# Patient Record
Sex: Female | Born: 1985 | Race: Black or African American | Hispanic: No | Marital: Single | Smoking: Never smoker
Health system: Southern US, Community
[De-identification: ages and names within clinical notes are randomized; demographics above are authoritative.]

---
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
## 2006-11-26 ENCOUNTER — Emergency Department (HOSPITAL_COMMUNITY): Admission: EM | Admit: 2006-11-26 | Discharge: 2006-11-26 | Payer: Self-pay | Admitting: Family Medicine

## 2007-03-17 ENCOUNTER — Ambulatory Visit (HOSPITAL_COMMUNITY): Admission: RE | Admit: 2007-03-17 | Discharge: 2007-03-17 | Payer: Self-pay | Admitting: Obstetrics

## 2007-07-31 ENCOUNTER — Inpatient Hospital Stay (HOSPITAL_COMMUNITY): Admission: AD | Admit: 2007-07-31 | Discharge: 2007-07-31 | Payer: Self-pay | Admitting: Family Medicine

## 2007-08-03 ENCOUNTER — Inpatient Hospital Stay (HOSPITAL_COMMUNITY): Admission: AD | Admit: 2007-08-03 | Discharge: 2007-08-03 | Payer: Self-pay | Admitting: Family Medicine

## 2007-08-06 ENCOUNTER — Inpatient Hospital Stay (HOSPITAL_COMMUNITY): Admission: AD | Admit: 2007-08-06 | Discharge: 2007-08-06 | Payer: Self-pay | Admitting: Obstetrics

## 2007-08-09 ENCOUNTER — Inpatient Hospital Stay (HOSPITAL_COMMUNITY): Admission: AD | Admit: 2007-08-09 | Discharge: 2007-08-09 | Payer: Self-pay | Admitting: Obstetrics & Gynecology

## 2007-08-10 ENCOUNTER — Inpatient Hospital Stay (HOSPITAL_COMMUNITY): Admission: RE | Admit: 2007-08-10 | Discharge: 2007-08-10 | Payer: Self-pay | Admitting: Obstetrics & Gynecology

## 2007-08-31 ENCOUNTER — Inpatient Hospital Stay (HOSPITAL_COMMUNITY): Admission: AD | Admit: 2007-08-31 | Discharge: 2007-08-31 | Payer: Self-pay | Admitting: Obstetrics & Gynecology

## 2007-10-18 ENCOUNTER — Inpatient Hospital Stay (HOSPITAL_COMMUNITY): Admission: AD | Admit: 2007-10-18 | Discharge: 2007-10-18 | Payer: Self-pay | Admitting: Obstetrics and Gynecology

## 2007-11-02 ENCOUNTER — Inpatient Hospital Stay (HOSPITAL_COMMUNITY): Admission: RE | Admit: 2007-11-02 | Discharge: 2007-11-02 | Payer: Self-pay | Admitting: Gynecology

## 2008-03-28 ENCOUNTER — Inpatient Hospital Stay (HOSPITAL_COMMUNITY): Admission: AD | Admit: 2008-03-28 | Discharge: 2008-04-02 | Payer: Self-pay | Admitting: Obstetrics and Gynecology

## 2009-02-21 ENCOUNTER — Emergency Department (HOSPITAL_BASED_OUTPATIENT_CLINIC_OR_DEPARTMENT_OTHER): Admission: EM | Admit: 2009-02-21 | Discharge: 2009-02-21 | Payer: Self-pay | Admitting: Emergency Medicine

## 2009-02-23 ENCOUNTER — Emergency Department (HOSPITAL_BASED_OUTPATIENT_CLINIC_OR_DEPARTMENT_OTHER): Admission: EM | Admit: 2009-02-23 | Discharge: 2009-02-23 | Payer: Self-pay | Admitting: Emergency Medicine

## 2009-08-13 ENCOUNTER — Emergency Department (HOSPITAL_COMMUNITY): Admission: EM | Admit: 2009-08-13 | Discharge: 2009-08-13 | Payer: Self-pay | Admitting: Family Medicine

## 2009-11-14 ENCOUNTER — Emergency Department (HOSPITAL_COMMUNITY): Admission: EM | Admit: 2009-11-14 | Discharge: 2009-11-14 | Payer: Self-pay | Admitting: Emergency Medicine

## 2009-12-17 ENCOUNTER — Emergency Department (HOSPITAL_COMMUNITY): Admission: EM | Admit: 2009-12-17 | Discharge: 2009-12-17 | Payer: Self-pay | Admitting: Family Medicine

## 2010-02-14 ENCOUNTER — Emergency Department (HOSPITAL_COMMUNITY): Admission: EM | Admit: 2010-02-14 | Discharge: 2010-02-14 | Payer: Self-pay | Admitting: Family Medicine

## 2010-11-24 ENCOUNTER — Encounter: Payer: Self-pay | Admitting: Obstetrics

## 2011-03-18 NOTE — Op Note (Signed)
NAMECARMIN, Angel Copeland                 ACCOUNT NO.:  [id number]   MEDICAL RECORD NO.:  [id number]          PATIENT TYPE:  INP   LOCATION:  9373                          FACILITY:  [facility]   PHYSICIAN:  Sherron Monday, MD        DATE OF BIRTH:  December 06, 1985   DATE OF PROCEDURE:  03/30/2008  DATE OF DISCHARGE:                               OPERATIVE REPORT   PREOPERATIVE DIAGNOSES:  1. Intrauterine pregnancy at term.  2. Preeclampsia.  3. Arrest of dilation.   POSTOPERATIVE DIAGNOSES:  1. Intrauterine pregnancy at term.  2. Preeclampsia.  3. Arrest of dilation.  4. Delivered via a low transverse cesarean section.   PROCEDURE:  Primary low transverse cesarean section.   SURGEON:  Sherron Monday, MD   ANESTHESIA:  Epidural.   FINDINGS:  Viable female infant at 0027, Mar 30, 2008, with Apgars of 8  at 1 minute and 9 at 5 minutes, and weight of 7 pounds 12 ounces.  Normal uterus, tubes, and ovaries.   COMPLICATIONS:  None.   PATHOLOGY:  None.   IV FLUIDS:  2500 mL.   URINE OUTPUT:  200 mL of clear urine at the end of procedure.   ESTIMATED BLOOD LOSS:  800 mL.   DISPOSITION:  To the PACU following the procedure.   PROCEDURE:  After informed consent was reviewed with patient including  the risks, benefits, and alternatives of the procedure including, but  not limited to bleeding, infection, damage to surrounding organs, as  well as trouble of healing; she was transferred to the operating room  where epidural was idosed and found to be adequate.  She was then  prepped and draped in the normal sterile fashion.  A Pfannenstiel skin  incision was made approximately 2 fingerbreadths above the pubic  symphysis and carried through to the underlying layer of fascia bluntly  and sharply with a Bovie cautery.  The incision was extended from the  midline with Mayo scissors.  The inferior aspect of this fascial  incision was grasped with Kocher clamp and elevated.  The peritoneum and  rectus  muscles were dissected off bluntly and sharply.  Attention was  then turned to the superior aspect of this incision, which was in  similar fashion was grasped with Kocher clamps and elevated, and the  rectus muscle was dissected off bluntly and sharply.  Midline was easily  identified and peritoneum was entered bluntly.  Peritoneal incision was  extended with good visualization.  The Alexis retractor was placed.  After confirmation of the placement, the placement was checked to assure  that no bowel was entrapped.  Midline was easily visualized, Peritoneum  was entered bluntly.  The vesicouterine peritoneum was then identified.   Bladder flap was created using Metzenbaum scissors.  The uterus was  incised in a transverse fashion.  The infant was delivered from vertex  presentation.  The nose and mouth were suctioned and the cord was  clamped and cut, the infant was handed off to the awaiting pediatric  staff.  Attention was then turned to express placenta, and placenta  expressed intact.  Uterus was cleared of all clots and debris.  Uterine  incision was closed in 2 layers with first as a running-locked layer and  the second as an imbricating.  Hemostasis was assured with additional  sutures of 0 Vicryl.  The gutters were cleared of all clots and debris,  and irrigation was performed.  Subfascial planes were inspected and made  hemostatic with Bovie cautery.  The fascia was closed with 0 Vicryl in a  running fashion.  Subcuticular adipose layer was made hemostatic with  Bovie cautery.  The skin was closed with staples after irrigation had  been performed.  The patient tolerated the procedure well.  Sponge, lap,  and needle counts were correct x2 at the end of the procedure.      Sherron Monday, MD  Electronically Signed     JB/MEDQ  D:  03/30/2008  T:  03/30/2008  Job:  [id number]

## 2011-03-21 NOTE — Discharge Summary (Signed)
NAMESHERIDEN, ARCHIBEQUE                 ACCOUNT NO.:  [id number]   MEDICAL RECORD NO.:  [id number]          PATIENT TYPE:  INP   LOCATION:  9132                          FACILITY:  [facility]   PHYSICIAN:  Huel Cote, M.D. DATE OF BIRTH:  1985-11-17   DATE OF ADMISSION:  03/28/2008  DATE OF DISCHARGE:  04/02/2008                               DISCHARGE SUMMARY   DISCHARGE DIAGNOSES:  1. Term pregnancy at 47 and 6/7th weeks, delivered.  2. Status post primary low transverse cesarean section for arrest of      dilation.  3. Pregnancy-induced hypertension with proteinuria and probable mild      preeclampsia.   DISCHARGE MEDICATIONS:  1. Motrin 600 mg p.o. every 6 hours.  2. Percocet 1-2 tablets p.o. every 4 hours p.r.n.   DISCHARGE FOLLOWUP:  The patient is to follow up in the office in  approximately 2 weeks for an incision check.   HOSPITAL COURSE:  The patient is a 25 year old G1 P0 who was admitted at  42 and 6/7th weeks gestation for induction of labor, given blood  pressures increasing to 140s over 90 to 106.  PIH labs had been normal;  however, urine catheterization revealed a urine with approximately 100  of protein and the patient presented with a mild headache.  Prenatal  labs are as follows, AB positive, antibody negative, RPR nonreactive,  rubella immune, hepatitis B surface, antigen negative, HIV negative, GC  negative, Chlamydia negative, group B strep positive, 1-hour Glucola  183, 3-hour Glucola was never done by the patient due to noncompliance.  First trimester screen was normal.   PAST OBSTETRICAL HISTORY:  None.   PAST GYN HISTORY:  None.   PAST MEDICAL HISTORY:  None.   PAST SURGICAL HISTORY:  None.   MEDICATIONS:  Prenatal vitamins only.   HOSPITAL COURSE:  On admission, blood pressure is 143/91.  Cervix was 32  and -3 station.  She did have 2+ pedal edema noted.  PIH labs were  normal.  Given increased blood pressure and the new finding of  proteinuria,  it was felt that the patient should proceed with delivery  even in light of a slightly unfavorable cervix.  She was placed on  penicillin for her group B strep positive status, and was placed on  Pitocin.  We were going to hold off on the magnesium until we determined  what her blood pressures would be in labor.  Approximately 4 hours after  admission, her cervix was 52+ and -2 station, and she had received her  penicillin.  She then had rupture of membranes performed with clear  fluid noted.  Blood pressures remained in the 90s to 100 diastolics.  As  she received an epidural at approximately 3 cm dilation, and at that  point, had a IUPC placed to adjust her Pitocin.  She progressed to  approximately 6-7 cm despite adequate contractions; however, over  several hours really could not progressed beyond this and was counseled  and proceeded with a cesarean section.  She was delivered of a viable  female infant, Apgars were 8  and 9 and weight of 7 pounds 12 ounces.  She was then admitted for routine postoperative care.  She was placed on  magnesium postpartum due to increasing blood pressures and this was  continued for approximately 1-1/2 days postpartum.  This was  discontinued and on postop day #3, she was doing quite well.  Her  incision was clear, staples removed, and Steri-Strips placed.  She was  tolerating regular diet.  Blood pressures were stable at 140s over 90s  and she was felt stable for discharge home.      Huel Cote, M.D.  Electronically Signed     KR/MEDQ  D:  04/26/2008  T:  04/27/2008  Job:  [id number]

## 2011-07-30 LAB — COMPREHENSIVE METABOLIC PANEL
ALT: 9
ALT: 9
AST: 15
AST: 18
Albumin: 2.7 — ABNORMAL LOW
Alkaline Phosphatase: 103
CO2: 24
CO2: 26
Calcium: 9
Chloride: 105
Chloride: 105
Creatinine, Ser: 0.59
GFR calc non Af Amer: >60
Glucose, Bld: 141 — ABNORMAL HIGH
Sodium: 137
Total Bilirubin: 0.3
Total Protein: 5.1 — ABNORMAL LOW
Total Protein: 6.5

## 2011-07-30 LAB — CBC
HCT: 25.3 — ABNORMAL LOW
Hemoglobin: 11.3 — ABNORMAL LOW
MCHC: 33.1
MCV: 77.7 — ABNORMAL LOW
RBC: 4.38
RDW: 14
RDW: 14.1
WBC: 5.9

## 2011-07-30 LAB — URINALYSIS, ROUTINE W REFLEX MICROSCOPIC
Bilirubin Urine: NEGATIVE
Glucose, UA: NEGATIVE
Hgb urine dipstick: NEGATIVE
Leukocytes, UA: NEGATIVE
Specific Gravity, Urine: 1.02
Urobilinogen, UA: 0.2

## 2011-07-30 LAB — URINE MICROSCOPIC-ADD ON

## 2011-07-30 LAB — LACTATE DEHYDROGENASE: LDH: 173

## 2011-08-08 LAB — URINALYSIS, ROUTINE W REFLEX MICROSCOPIC
Bilirubin Urine: NEGATIVE
Glucose, UA: NEGATIVE
Hgb urine dipstick: NEGATIVE
Ketones, ur: 15 — AB
Nitrite: NEGATIVE
Protein, ur: NEGATIVE
Specific Gravity, Urine: 1.015
Urobilinogen, UA: 0.2
pH: 7.5

## 2011-08-08 LAB — GC/CHLAMYDIA PROBE AMP, GENITAL: Chlamydia, DNA Probe: NEGATIVE

## 2011-08-08 LAB — WET PREP, GENITAL: Trich, Wet Prep: NONE SEEN

## 2011-08-13 LAB — URINALYSIS, ROUTINE W REFLEX MICROSCOPIC
Bilirubin Urine: NEGATIVE
Glucose, UA: NEGATIVE
Hgb urine dipstick: NEGATIVE
Urobilinogen, UA: 0.2
pH: 7

## 2011-08-13 LAB — WET PREP, GENITAL: Trich, Wet Prep: NONE SEEN

## 2011-08-14 LAB — URINE MICROSCOPIC-ADD ON

## 2011-08-14 LAB — POCT PREGNANCY, URINE: Preg Test, Ur: POSITIVE

## 2011-08-14 LAB — URINALYSIS, ROUTINE W REFLEX MICROSCOPIC
Bilirubin Urine: NEGATIVE
Glucose, UA: NEGATIVE
Glucose, UA: NEGATIVE
Hgb urine dipstick: NEGATIVE
Hgb urine dipstick: NEGATIVE
Ketones, ur: NEGATIVE
Leukocytes, UA: NEGATIVE
Protein, ur: NEGATIVE
Specific Gravity, Urine: 1.02
Specific Gravity, Urine: 1.02
Urobilinogen, UA: 1
Urobilinogen, UA: 0.2

## 2011-08-14 LAB — WET PREP, GENITAL
Clue Cells Wet Prep HPF POC: NONE SEEN
Trich, Wet Prep: NONE SEEN
Yeast Wet Prep HPF POC: NONE SEEN

## 2011-08-14 LAB — GC/CHLAMYDIA PROBE AMP, GENITAL: Chlamydia, DNA Probe: NEGATIVE

## 2011-08-14 LAB — HCG, QUANTITATIVE, PREGNANCY: hCG, Beta Chain, Quant, S: 15537 — ABNORMAL HIGH

## 2011-08-14 LAB — CBC
HCT: 32.7 — ABNORMAL LOW
Hemoglobin: 11 — ABNORMAL LOW
MCV: 75.4 — ABNORMAL LOW
Platelets: 404 — ABNORMAL HIGH
RBC: 4.33
WBC: 8.1

## 2011-08-14 LAB — ABO/RH: ABO/RH(D): AB POS
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
## 2013-01-19 ENCOUNTER — Emergency Department (HOSPITAL_COMMUNITY)
Admission: EM | Admit: 2013-01-19 | Discharge: 2013-01-19 | Discharge status: Absent without leave | Payer: 59 | Source: Home / Self Care | Attending: Emergency Medicine | Admitting: Emergency Medicine

## 2013-01-21 ENCOUNTER — Inpatient Hospital Stay (HOSPITAL_COMMUNITY)
Admission: AD | Admit: 2013-01-21 | Discharge: 2013-01-21 | Disposition: A | Discharge status: Home / Self Care | Payer: 59 | Source: Ambulatory Visit | Attending: Obstetrics and Gynecology | Admitting: Obstetrics and Gynecology

## 2013-01-21 ENCOUNTER — Encounter (HOSPITAL_COMMUNITY): Payer: Self-pay | Admitting: General Practice

## 2013-01-21 DIAGNOSIS — N760 Acute vaginitis: Secondary | ICD-10-CM | POA: Insufficient documentation

## 2013-01-21 DIAGNOSIS — A499 Bacterial infection, unspecified: Secondary | ICD-10-CM | POA: Insufficient documentation

## 2013-01-21 DIAGNOSIS — R109 Unspecified abdominal pain: Secondary | ICD-10-CM | POA: Insufficient documentation

## 2013-01-21 DIAGNOSIS — B9689 Other specified bacterial agents as the cause of diseases classified elsewhere: Secondary | ICD-10-CM | POA: Insufficient documentation

## 2013-01-21 DIAGNOSIS — Z3202 Encounter for pregnancy test, result negative: Secondary | ICD-10-CM | POA: Insufficient documentation

## 2013-01-21 LAB — CBC WITH DIFFERENTIAL/PLATELET
Basophils Relative: 0 % (ref 0–1)
Eosinophils Absolute: 0.1 10*3/uL (ref 0.0–0.7)
Eosinophils Relative: 2 % (ref 0–5)
Hemoglobin: 12.1 g/dL (ref 12.0–15.0)
Lymphs Abs: 2.2 10*3/uL (ref 0.7–4.0)
MCH: 25.7 pg — ABNORMAL LOW (ref 26.0–34.0)
MCHC: 32.1 g/dL (ref 30.0–36.0)
MCV: 80.2 fL (ref 78.0–100.0)
Monocytes Relative: 6 % (ref 3–12)
Platelets: 288 10*3/uL (ref 150–400)
RBC: 4.7 MIL/uL (ref 3.87–5.11)

## 2013-01-21 LAB — URINALYSIS, ROUTINE W REFLEX MICROSCOPIC
Bilirubin Urine: NEGATIVE
Hgb urine dipstick: NEGATIVE
Ketones, ur: 15 mg/dL — AB
Nitrite: NEGATIVE
Specific Gravity, Urine: 1.025 (ref 1.005–1.030)
Urobilinogen, UA: 0.2 mg/dL (ref 0.0–1.0)

## 2013-01-21 LAB — WET PREP, GENITAL
Trich, Wet Prep: NONE SEEN
Yeast Wet Prep HPF POC: NONE SEEN

## 2013-01-21 LAB — URINE MICROSCOPIC-ADD ON

## 2013-01-21 MED ORDER — METRONIDAZOLE 500 MG PO TABS: 500.0000 mg | ORAL_TABLET | Freq: Two times a day (BID) | ORAL | Status: DC

## 2013-01-21 NOTE — MAU Note (Addendum)
Pt stated stomach is hurting her since  01/14/2013 , she vomited 2 days ago and today. Has a headache and back pain

## 2013-01-21 NOTE — MAU Provider Note (Signed)
History     CSN: [id number]  Arrival date and time: 01/21/13 1534   First Provider Initiated Contact with Patient 01/21/13 1630      Chief Complaint  Patient presents with  . Abdominal Pain   HPI Angel Copeland is 27 y.o. G1P0011 Unknown weeks presenting with abdominal pain X 1 week.  Describes as cramping but not as painful as cramping in her lower abdomen.  Patient of Dr. Emeline Darling.  Didn't call office because it is hard for her to get off work. LMP2/15/14.  Normal cycle for her.  She is now 1 week late.  Sexually active, not using contraception because it makes her sick.  Had implanon last 2 years ago.  She would not be opposed to returning to pills but prefers something that doesn't require daily use.  Nausea and vomiting.   Has vomited X 2 today.  Denies fever, chills, constipation, diarrhea, or UTI sxs. 1 partner X 12 years.    History reviewed. No pertinent past medical history.  Past Surgical History  Procedure Laterality Date  . Cesarean section      Family History  Problem Relation Age of Onset  . Diabetes Father     History  Substance Use Topics  . Smoking status: Not on file  . Smokeless tobacco: Not on file  . Alcohol Use: Yes    Allergies: No Known Allergies  Prescriptions prior to admission  Medication Sig Dispense Refill  . Aspirin-Acetaminophen-Caffeine (PAMPRIN MAX PO) Take 1 tablet by mouth daily as needed (for cramps).        Review of Systems  Constitutional: Negative for fever and chills.  Gastrointestinal: Positive for nausea, vomiting and abdominal pain. Negative for diarrhea and constipation.  Genitourinary: Negative.        Menses 1 week late   Physical Exam   Blood pressure 125/75, temperature 98.1 F (36.7 C), temperature source Oral, resp. rate 18, height 5\' 2"  (1.575 m), weight 214 lb 12.8 oz (97.433 kg), last menstrual period 12/17/2012, SpO2 100.00%.  Physical Exam  Constitutional: She is oriented to person, place, and time. She  appears well-developed and well-nourished. No distress.  HENT:  Head: Normocephalic.  Neck: Normal range of motion.  Cardiovascular: Normal rate.   Respiratory: Effort normal.  GI: Soft. She exhibits no distension and no mass. There is no tenderness. There is no rebound and no guarding.  Genitourinary: There is no rash, tenderness or lesion on the right labia. There is no rash, tenderness or lesion on the left labia. Uterus is tender (mild). Uterus is not enlarged. Cervix exhibits no motion tenderness, no discharge and no friability. Right adnexum displays no mass, no tenderness and no fullness. Left adnexum displays no mass, no tenderness and no fullness. No bleeding around the vagina. Vaginal discharge (small amount of  frothy white discharge without odor) found.  Neurological: She is alert and oriented to person, place, and time.  Skin: Skin is warm and dry.  Psychiatric: She has a normal mood and affect. Her behavior is normal.   Results for orders placed during the hospital encounter of 01/21/13 (from the past 24 hour(s))  URINALYSIS, ROUTINE W REFLEX MICROSCOPIC     Status: Abnormal   Collection Time    01/21/13  3:47 PM      Result Value Range   Color, Urine YELLOW  YELLOW   APPearance CLEAR  CLEAR   Specific Gravity, Urine 1.025  1.005 - 1.030   pH 6.0  5.0 - 8.0  Glucose, UA >1000 (*) NEGATIVE mg/dL   Hgb urine dipstick NEGATIVE  NEGATIVE   Bilirubin Urine NEGATIVE  NEGATIVE   Ketones, ur 15 (*) NEGATIVE mg/dL   Protein, ur NEGATIVE  NEGATIVE mg/dL   Urobilinogen, UA 0.2  0.0 - 1.0 mg/dL   Nitrite NEGATIVE  NEGATIVE   Leukocytes, UA NEGATIVE  NEGATIVE  URINE MICROSCOPIC-ADD ON     Status: Abnormal   Collection Time    01/21/13  3:47 PM      Result Value Range   Squamous Epithelial / LPF FEW (*) RARE   WBC, UA 0-2  <3 WBC/hpf   RBC / HPF 0-2  <3 RBC/hpf   Bacteria, UA RARE  RARE  POCT PREGNANCY, URINE     Status: None   Collection Time    01/21/13  3:58 PM      Result  Value Range   Preg Test, Ur NEGATIVE  NEGATIVE  WET PREP, GENITAL     Status: Abnormal   Collection Time    01/21/13  4:50 PM      Result Value Range   Yeast Wet Prep HPF POC NONE SEEN  NONE SEEN   Trich, Wet Prep NONE SEEN  NONE SEEN   Clue Cells Wet Prep HPF POC MODERATE (*) NONE SEEN   WBC, Wet Prep HPF POC FEW (*) NONE SEEN  CBC WITH DIFFERENTIAL     Status: Abnormal   Collection Time    01/21/13  5:20 PM      Result Value Range   WBC 6.6  4.0 - 10.5 K/uL   RBC 4.70  3.87 - 5.11 MIL/uL   Hemoglobin 12.1  12.0 - 15.0 g/dL   HCT 16.1  09.6 - 04.5 %   MCV 80.2  78.0 - 100.0 fL   MCH 25.7 (*) 26.0 - 34.0 pg   MCHC 32.1  30.0 - 36.0 g/dL   RDW 40.9  81.1 - 91.4 %   Platelets 288  150 - 400 K/uL   Neutrophils Relative 59  43 - 77 %   Neutro Abs 3.9  1.7 - 7.7 K/uL   Lymphocytes Relative 33  12 - 46 %   Lymphs Abs 2.2  0.7 - 4.0 K/uL   Monocytes Relative 6  3 - 12 %   Monocytes Absolute 0.4  0.1 - 1.0 K/uL   Eosinophils Relative 2  0 - 5 %   Eosinophils Absolute 0.1  0.0 - 0.7 K/uL   Basophils Relative 0  0 - 1 %   Basophils Absolute 0.0  0.0 - 0.1 K/uL   MAU Course  Procedures  MDM 17:15  Reported MSE to Dr. Senaida Ores.   Assessment and Plan  A:  Abdominal pain      Negative pregnancy test     Bacterial vaginosis  P  Rx for Flagyl 500mg  bid X 1 week     Return to [facility] to discuss birth control.  Angel Copeland,EVE M 01/21/2013, 5:52 PM

## 2013-01-22 LAB — GC/CHLAMYDIA PROBE AMP: CT Probe RNA: NEGATIVE

## 2013-03-04 ENCOUNTER — Ambulatory Visit: Payer: 59 | Admitting: Diagnostic Neuroimaging

## 2013-10-29 ENCOUNTER — Emergency Department (HOSPITAL_BASED_OUTPATIENT_CLINIC_OR_DEPARTMENT_OTHER)
Admission: EM | Admit: 2013-10-29 | Discharge: 2013-10-29 | Disposition: A | Discharge status: Home / Self Care | Payer: 59 | Attending: Emergency Medicine | Admitting: Emergency Medicine

## 2013-10-29 ENCOUNTER — Encounter (HOSPITAL_BASED_OUTPATIENT_CLINIC_OR_DEPARTMENT_OTHER): Payer: Self-pay | Admitting: Emergency Medicine

## 2013-10-29 DIAGNOSIS — R51 Headache: Secondary | ICD-10-CM | POA: Insufficient documentation

## 2013-10-29 DIAGNOSIS — R6883 Chills (without fever): Secondary | ICD-10-CM | POA: Insufficient documentation

## 2013-10-29 DIAGNOSIS — R6889 Other general symptoms and signs: Secondary | ICD-10-CM

## 2013-10-29 DIAGNOSIS — J3489 Other specified disorders of nose and nasal sinuses: Secondary | ICD-10-CM | POA: Insufficient documentation

## 2013-10-29 DIAGNOSIS — IMO0001 Reserved for inherently not codable concepts without codable children: Secondary | ICD-10-CM | POA: Insufficient documentation

## 2013-10-29 DIAGNOSIS — J111 Influenza due to unidentified influenza virus with other respiratory manifestations: Secondary | ICD-10-CM | POA: Insufficient documentation

## 2013-10-29 DIAGNOSIS — J029 Acute pharyngitis, unspecified: Secondary | ICD-10-CM | POA: Insufficient documentation

## 2013-10-29 MED ORDER — GUAIFENESIN-CODEINE 100-10 MG/5ML PO SYRP: 5.0000 mL | ORAL_SOLUTION | Freq: Three times a day (TID) | ORAL | Status: AC | PRN

## 2013-10-29 NOTE — ED Notes (Signed)
Patient states no N/V since Thursday. Now presents with daughter for same symptoms., cough, congestion, headache.

## 2013-10-29 NOTE — ED Provider Notes (Signed)
CSN: [id number]     Arrival date & time 10/29/13  1102 History   First MD Initiated Contact with Patient 10/29/13 1213     Chief Complaint  Patient presents with  . Nasal Congestion  . Cough  . Headache   (Consider location/radiation/quality/duration/timing/severity/associated sxs/prior Treatment) Patient is a 27 y.o. female presenting with cough and headaches. The history is provided by the patient.  Cough Cough characteristics:  Dry Onset quality:  Gradual Duration:  4 days Timing:  Sporadic Progression:  Improving Chronicity:  New Smoker: no   Worsened by:  Nothing tried Associated symptoms: chills, headaches, myalgias, rhinorrhea and sore throat   Associated symptoms: no ear pain, no eye discharge, no fever, no rash, no shortness of breath, no sinus congestion and no wheezing   Headache Associated symptoms: cough, myalgias and sore throat   Associated symptoms: no ear pain and no fever    Angel Copeland is a 27 y.o. female who presents to the ED with cough, cold and congestin that started 4 days ago. She was feeling really bad and had to stay in bed all day one day. She is feeling better now after taking OTC medications but still has a cough.    No past medical history on file. Past Surgical History  Procedure Laterality Date  . Cesarean section     Family History  Problem Relation Age of Onset  . Diabetes Father    History  Substance Use Topics  . Smoking status: Never Smoker   . Smokeless tobacco: Not on file  . Alcohol Use: Yes   OB History   Grav Para Term Preterm Abortions TAB SAB Ect Mult Living   1    1   1  1      Review of Systems  Constitutional: Positive for chills. Negative for fever.  HENT: Positive for rhinorrhea and sore throat. Negative for ear pain.   Eyes: Negative for discharge and redness.  Respiratory: Positive for cough. Negative for shortness of breath and wheezing.   Musculoskeletal: Positive for myalgias.  Skin: Negative for rash.   Allergic/Immunologic: Negative for immunocompromised state.  Neurological: Positive for headaches.  Psychiatric/Behavioral: The patient is not nervous/anxious.     Allergies  Review of patient's allergies indicates no known allergies.  Home Medications  No current outpatient prescriptions on file. BP 125/89  Pulse 89  Temp(Src) 98.3 F (36.8 C) (Oral)  Resp 18  Ht 5\' 2"  (1.575 m)  Wt 204 lb (92.534 kg)  BMI 37.30 kg/m2  SpO2 98%  LMP 09/14/2013 Physical Exam  Nursing note and vitals reviewed. Constitutional: She is oriented to person, place, and time. She appears well-developed and well-nourished. No distress.  HENT:  Head: Normocephalic and atraumatic.  Right Ear: Tympanic membrane normal.  Left Ear: Tympanic membrane normal.  Nose: Nose normal.  Mouth/Throat: Uvula is midline, oropharynx is clear and moist and mucous membranes are normal.  Eyes: Conjunctivae and EOM are normal. Pupils are equal, round, and reactive to light.  Neck: Normal range of motion. Neck supple.  Cardiovascular: Normal rate, regular rhythm and normal heart sounds.   Pulmonary/Chest: Effort normal and breath sounds normal.  Abdominal: Soft. There is no tenderness.  Musculoskeletal: Normal range of motion.  Neurological: She is alert and oriented to person, place, and time. No cranial nerve deficit.  Skin: Skin is warm and dry.  Psychiatric: She has a normal mood and affect. Her behavior is normal.    ED Course  Procedures   MDM  27 y.o. female with cough, cold and congestion that has improved with OTC medications, but problem sleeping due to cough. Will treat with cough medication and patient will use humidifier. Stable for discharge O2 SAT 98% on R/A. Discussed with the patient clinical findings and plan of care. All questioned fully answered. She will return if any problems arise.    Medication List         guaiFENesin-codeine 100-10 MG/5ML syrup  Commonly known as:  ROBITUSSIN AC  Take 5  mLs by mouth 3 (three) times daily as needed for cough.           Janne Napoleon, NP 11/03/13 1009

## 2013-10-29 NOTE — ED Notes (Signed)
Nasal congestion, cough, chills, headache since Wednesday.

## 2013-11-03 NOTE — ED Provider Notes (Signed)
Medical screening examination/treatment/procedure(s) were performed by non-physician practitioner and as supervising physician I was immediately available for consultation/collaboration.     Skyy Nilan, MD 11/03/13 1458 

## 2014-09-04 ENCOUNTER — Encounter (HOSPITAL_BASED_OUTPATIENT_CLINIC_OR_DEPARTMENT_OTHER): Payer: Self-pay | Admitting: Emergency Medicine
